# Patient Record
Sex: Male | Born: 1980 | Race: Black or African American | Hispanic: No | Marital: Married | Smoking: Never smoker
Health system: Southern US, Community
[De-identification: ages and names within clinical notes are randomized; demographics above are authoritative.]

---

## 2015-04-26 ENCOUNTER — Emergency Department (HOSPITAL_COMMUNITY): Payer: Self-pay

## 2015-04-26 ENCOUNTER — Encounter (HOSPITAL_COMMUNITY): Payer: Self-pay

## 2015-04-26 ENCOUNTER — Emergency Department (HOSPITAL_COMMUNITY)
Admission: EM | Admit: 2015-04-26 | Discharge: 2015-04-26 | Disposition: A | Discharge status: Home / Self Care | Payer: Self-pay | Attending: Emergency Medicine | Admitting: Emergency Medicine

## 2015-04-26 DIAGNOSIS — R079 Chest pain, unspecified: Secondary | ICD-10-CM | POA: Insufficient documentation

## 2015-04-26 LAB — COMPREHENSIVE METABOLIC PANEL
ALBUMIN: 4.2 g/dL (ref 3.5–5.0)
ALK PHOS: 102 U/L (ref 38–126)
ALT: 27 U/L (ref 17–63)
ANION GAP: 9 (ref 5–15)
AST: 24 U/L (ref 15–41)
BILIRUBIN TOTAL: 0.5 mg/dL (ref 0.3–1.2)
BUN: 9 mg/dL (ref 6–20)
CALCIUM: 9 mg/dL (ref 8.9–10.3)
CO2: 23 mmol/L (ref 22–32)
Chloride: 105 mmol/L (ref 101–111)
Creatinine, Ser: 0.76 mg/dL (ref 0.61–1.24)
GLUCOSE: 112 mg/dL — AB (ref 65–99)
POTASSIUM: 3.9 mmol/L (ref 3.5–5.1)
Sodium: 137 mmol/L (ref 135–145)
TOTAL PROTEIN: 8.4 g/dL — AB (ref 6.5–8.1)

## 2015-04-26 LAB — CBC WITH DIFFERENTIAL/PLATELET
BASOS PCT: 1 %
Basophils Absolute: 0 10*3/uL (ref 0.0–0.1)
Eosinophils Absolute: 0.2 10*3/uL (ref 0.0–0.7)
Eosinophils Relative: 3 %
HEMATOCRIT: 47.1 % (ref 39.0–52.0)
HEMOGLOBIN: 16.1 g/dL (ref 13.0–17.0)
LYMPHS ABS: 2.3 10*3/uL (ref 0.7–4.0)
LYMPHS PCT: 33 %
MCH: 30 pg (ref 26.0–34.0)
MCHC: 34.2 g/dL (ref 30.0–36.0)
MCV: 87.7 fL (ref 78.0–100.0)
MONO ABS: 0.5 10*3/uL (ref 0.1–1.0)
MONOS PCT: 7 %
NEUTROS ABS: 4 10*3/uL (ref 1.7–7.7)
NEUTROS PCT: 56 %
Platelets: 295 10*3/uL (ref 150–400)
RBC: 5.37 MIL/uL (ref 4.22–5.81)
RDW: 12.8 % (ref 11.5–15.5)
WBC: 6.9 10*3/uL (ref 4.0–10.5)

## 2015-04-26 LAB — TROPONIN I

## 2015-04-26 LAB — I-STAT TROPONIN, ED: Troponin i, poc: 0 ng/mL (ref 0.00–0.08)

## 2015-04-26 NOTE — ED Notes (Signed)
EKG given to EDP Oni for review 

## 2015-04-26 NOTE — ED Provider Notes (Signed)
CSN: [id number]     Arrival date & time 04/26/15  0359 History   First MD Initiated Contact with Patient 04/26/15 [phone]     Chief Complaint  Patient presents with  . Chest Pain     (Consider location/radiation/quality/duration/timing/severity/associated sxs/prior Treatment) Patient is a 34 y.o. male presenting with chest pain. The history is provided by the patient. No language interpreter was used.  Chest Pain Pain location:  L chest and epigastric Pain quality: sharp   Pain radiates to:  Does not radiate Pain radiates to the back: no   Onset quality:  Unable to specify Duration:  1 hour Timing:  Constant Progression:  Resolved Chronicity:  New Context: at rest   Context: not breathing and not eating   Relieved by:  Nothing Worsened by:  Nothing tried Associated symptoms: no heartburn and no shortness of breath   Risk factors: no hypertension   Pt awoke from sleep with chest pain.  Pt reports pain has resolved.    History reviewed. No pertinent past medical history. History reviewed. No pertinent past surgical history. History reviewed. No pertinent family history. Social History  Substance Use Topics  . Smoking status: Never Smoker   . Smokeless tobacco: None  . Alcohol Use: No    Review of Systems  Respiratory: Negative for shortness of breath.   Cardiovascular: Positive for chest pain.  Gastrointestinal: Negative for heartburn.  All other systems reviewed and are negative.     Allergies  Shellfish allergy  Home Medications   Prior to Admission medications   Not on File   BP 144/91 mmHg  Pulse 81  Temp(Src) 97.7 F (36.5 C) (Oral)  Resp 20  Ht 5\' 9"  (1.753 m)  Wt 225 lb (102.059 kg)  BMI 33.21 kg/m2  SpO2 95% Physical Exam  Constitutional: He appears well-developed and well-nourished.  HENT:  Head: Normocephalic.  Eyes: Pupils are equal, round, and reactive to light.  Neck: Normal range of motion.  Cardiovascular: Normal rate and regular  rhythm.   Pulmonary/Chest: Effort normal and breath sounds normal.  Abdominal: Soft.  Musculoskeletal: Normal range of motion.  Neurological: He is alert.  Skin: Skin is warm.  Psychiatric: He has a normal mood and affect.  Nursing note and vitals reviewed.   ED Course  Procedures (including critical care time) Labs Review Labs Reviewed - No data to display  Imaging Review Dg Chest 2 View  04/26/2015   CLINICAL DATA:  Awoke this morning with left mid chest pain.  EXAM: CHEST  2 VIEW  COMPARISON:  None.  FINDINGS: The cardiomediastinal contours are normal. The lungs are clear. Pulmonary vasculature is normal. No consolidation, pleural effusion, or pneumothorax. No acute osseous abnormalities are seen.  IMPRESSION: No acute pulmonary process.   Electronically Signed   By: Rubye Oaks M.D.   On: 04/26/2015 05:39   I have personally reviewed and evaluated these images and lab results as part of my medical decision-making.   EKG Interpretation   Date/Time:  Friday April 26 2015 04:10:26 EDT Ventricular Rate:  75 PR Interval:  139 QRS Duration: 83 QT Interval:  381 QTC Calculation: 425 R Axis:   57 Text Interpretation:  Sinus rhythm Confirmed by Erroll Luna  [phone]) on 04/26/2015 6:41:13 AM      MDM  Troponin negative.  Troponin is 3 hours post pain Cbc and cmet normal I suspect possible reflux.   Pt given primary care referrals.     Final diagnoses:  Nonspecific chest  pain    resouce guide Return if any problems.    Lonia Skinner [place], PA-C 04/26/15 4098  Tomasita Crumble, MD 04/26/15 1500

## 2015-04-26 NOTE — ED Notes (Signed)
Pt complains of chest pain when waking up this am, no other symptoms

## 2015-04-26 NOTE — Discharge Instructions (Signed)
Chest Pain (Nonspecific) °It is often hard to give a specific diagnosis for the cause of chest pain. There is always a chance that your pain could be related to something serious, such as a heart attack or a blood clot in the lungs. You need to follow up with your health care provider for further evaluation. °CAUSES  °· Heartburn. °· Pneumonia or bronchitis. °· Anxiety or stress. °· Inflammation around your heart (pericarditis) or lung (pleuritis or pleurisy). °· A blood clot in the lung. °· A collapsed lung (pneumothorax). It can develop suddenly on its own (spontaneous pneumothorax) or from trauma to the chest. °· Shingles infection (herpes zoster virus). °The chest wall is composed of bones, muscles, and cartilage. Any of these can be the source of the pain. °· The bones can be bruised by injury. °· The muscles or cartilage can be strained by coughing or overwork. °· The cartilage can be affected by inflammation and become sore (costochondritis). °DIAGNOSIS  °Lab tests or other studies may be needed to find the cause of your pain. Your health care provider may have you take a test called an ambulatory electrocardiogram (ECG). An ECG records your heartbeat patterns over a 24-hour period. You may also have other tests, such as: °· Transthoracic echocardiogram (TTE). During echocardiography, sound waves are used to evaluate how blood flows through your heart. °· Transesophageal echocardiogram (TEE). °· Cardiac monitoring. This allows your health care provider to monitor your heart rate and rhythm in real time. °· Holter monitor. This is a portable device that records your heartbeat and can help diagnose heart arrhythmias. It allows your health care provider to track your heart activity for several days, if needed. °· Stress tests by exercise or by giving medicine that makes the heart beat faster. °TREATMENT  °· Treatment depends on what may be causing your chest pain. Treatment may include: °¨ Acid blockers for  heartburn. °¨ Anti-inflammatory medicine. °¨ Pain medicine for inflammatory conditions. °¨ Antibiotics if an infection is present. °· You may be advised to change lifestyle habits. This includes stopping smoking and avoiding alcohol, caffeine, and chocolate. °· You may be advised to keep your head raised (elevated) when sleeping. This reduces the chance of acid going backward from your stomach into your esophagus. °Most of the time, nonspecific chest pain will improve within 2-3 days with rest and mild pain medicine.  °HOME CARE INSTRUCTIONS  °· If antibiotics were prescribed, take them as directed. Finish them even if you start to feel better. °· For the next few days, avoid physical activities that bring on chest pain. Continue physical activities as directed. °· Do not use any tobacco products, including cigarettes, chewing tobacco, or electronic cigarettes. °· Avoid drinking alcohol. °· Only take medicine as directed by your health care provider. °· Follow your health care provider's suggestions for further testing if your chest pain does not go away. °· Keep any follow-up appointments you made. If you do not go to an appointment, you could develop lasting (chronic) problems with pain. If there is any problem keeping an appointment, call to reschedule. °SEEK MEDICAL CARE IF:  °· Your chest pain does not go away, even after treatment. °· You have a rash with blisters on your chest. °· You have a fever. °SEEK IMMEDIATE MEDICAL CARE IF:  °· You have increased chest pain or pain that spreads to your arm, neck, jaw, back, or abdomen. °· You have shortness of breath. °· You have an increasing cough, or you cough   up blood. °· You have severe back or abdominal pain. °· You feel nauseous or vomit. °· You have severe weakness. °· You faint. °· You have chills. °This is an emergency. Do not wait to see if the pain will go away. Get medical help at once. Call your local emergency services (911 in [place] Do not drive  yourself to the hospital. °MAKE SURE YOU:  °· Understand these instructions. °· Will watch your condition. °· Will get help right away if you are not doing well or get worse. °Document Released: 05/06/2005 Document Revised: 08/01/2013 Document Reviewed: 03/01/2008 °ExitCare® Patient Information ©2015 [employer]. This information is not intended to replace advice given to you by your health care provider. Make sure you discuss any questions you have with your health care provider. ° ° °Emergency Department Resource Guide °1) Find a Doctor and Pay Out of Pocket °Although you won't have to find out who is covered by your insurance plan, it is a good idea to ask around and get recommendations. You will then need to call the office and see if the doctor you have chosen will accept you as a new patient and what types of options they offer for patients who are self-pay. Some doctors offer discounts or will set up payment plans for their patients who do not have insurance, but you will need to ask so you aren't surprised when you get to your appointment. ° °2) Contact Your Local Health Department °Not all health departments have doctors that can see patients for sick visits, but many do, so it is worth a call to see if yours does. If you don't know where your local health department is, you can check in your phone book. The CDC also has a tool to help you locate your state's health department, and many state websites also have listings of all of their local health departments. ° °3) Find a Walk-in Clinic °If your illness is not likely to be very severe or complicated, you may want to try a walk in clinic. These are popping up all over the country in pharmacies, drugstores, and shopping centers. They're usually staffed by nurse practitioners or physician assistants that have been trained to treat common illnesses and complaints. They're usually fairly quick and inexpensive. However, if you have serious medical issues or  chronic medical problems, these are probably not your best option. ° °No Primary Care Doctor: °- Call [facility] at  832-8000 - they can help you locate a primary care doctor that  accepts your insurance, provides certain services, etc. °- Physician Referral Service- 1-800-533-3463 ° °Chronic Pain Problems: °Organization         Address  Phone   Notes  °[facility]  (336) 297-2271 Patients need to be referred by their primary care doctor.  ° °Medication Assistance: °Organization         Address  Phone   Notes  °[place] [facility] [address] °[place], [place] [address] (336) 641-8030 --Must be a resident of [place] °-- Must have NO insurance coverage whatsoever (no Medicaid/ Medicare, etc.) °-- The pt. MUST have a primary care doctor that directs their care regularly and follows them in the community °  °[facility]  (866) 331-1348   °[employer]  (888) 892-1162   ° °Agencies that provide inexpensive medical care: °Organization         Address  Phone   Notes  °[facility]  (  336) 832-8035   °[facility]    (336) 832-7272   °[facility] [address] °[place], [place] [address] (336) 832-4777   °[facility] [address], °[place] (336) 271-4999   °[facility]    (336) 373-0678   °[facility]    (336) 272-1050   °[facility] ° [address], [place] Phone:  (336) 832-4444, Fax:  (336) 832-4440 Hours of Operation:  9 am - 6 pm, M-F.  Also accepts Medicaid/Medicare and self-pay.  °[facility] ° [address], [place] Phone: (336) 832-3150, Fax: (336) [phone]. Hours of Operation:  8:30 am - 5:30 pm, M-F.  Also accepts Medicaid and self-pay.  °[facility] [place] [address], [place] Phone: (336) 878-6027   °[facility] [address], [place], [place]  (336)723-1848, Ext. 123 Mondays & Thursdays: 7-9 AM.  First 15 patients are seen on a first come, first serve basis. °  ° °Medicaid-accepting [place] Providers: ° °Organization         Address  Phone   Notes  °[facility] [address], [place] (336) 641-2100 Also accepts self-pay patients.  °[facility] [address], [place] ° (336) 856-9996   °[facility] [address], [place] (336) 288-8857   °[facility] [address] [place] [address], [place] (336) 299-7000   °Veita Bland [address], [place]  ° (336) 373-1557 Only accepts [place] Access Medicaid patients after they have their name applied to their card.  ° °Self-Pay (no insurance) in [place]: ° °Organization         Address  Phone   Notes  °Sickle Cell Patients, [facility] [address], [place] (336) 832-1970   °[facility] [address], [place] (336) 832-4400   °[facility] [place] ° [address], [place] (336) 992-4800   °[facility]/Dr. Osei-Bonsu ° [address] [place] [address], [place] or [address], [place] (336) 841-8500 Phone number for both [place] and [place] locations is the same.  °[facility] [address], [place] (336) 299-0000   °[facility] [address] [place] [address], [place] or [address] (336) 852-7530 °(336) 878-2260   °[facility] [address], [place] (336) 350-1642, phone; (336) 294-5005, fax Sees patients 1st and 3rd Saturday of every month.  Must not qualify for public or private insurance (i.e. Medicaid, Medicare, [place] Health Choice, Veterans' Benefits) • Household income should be no more than 200% of the poverty level •The clinic cannot treat you if you are pregnant or think you are pregnant • Sexually transmitted  diseases are not treated at the clinic.  ° ° °Dental Care: °Organization         Address  Phone  Notes  °[facility] [address],  (336) 641-6152 Accepts children up to age 21 who are enrolled in Medicaid or [place] Health Choice; pregnant women with a Medicaid card; and children who have applied for Medicaid or [place] Health Choice, but were declined, whose parents can pay a reduced fee at time of service.  °[facility] [place]    [address], [place] (336) 641-7733 Accepts children up to age 21 who are enrolled in Medicaid or [place] Health Choice; pregnant women with a Medicaid card; and children who have applied for Medicaid or [place] Health Choice, but were declined, whose parents can pay a reduced fee at time of service.  °[facility] ° [address], [place] (336) 641-4533 Patients are seen by appointment only. Walk-ins are not accepted. [facility] will see patients 18 years of age and older. °Monday - Tuesday (8am-5pm) °Most Wednesdays (8:30-5pm) °$30 per visit, cash only  °[facility] ° [address], [place] (336) 641-4533 Patients are seen by appointment only. Walk-ins are not accepted. [facility] will see patients 18 years of age and older. °One Wednesday Evening (Monthly: Volunteer Based).  $30 per visit, cash only  °[facility]  (919) 537-3737 for adults; Children under age 4, call Graduate Pediatric Dentistry at ([phone]) [phone]. Children aged 4-14, please call (919) [phone] to request a pediatric application. ° Dental services are provided in all areas of dental care including fillings, crowns and bridges, complete and partial dentures, implants, gum treatment, root canals, and extractions. Preventive care is also provided. Treatment is provided to both adults and children. °Patients are selected via a  lottery and there is often a waiting list. °  °[facility] [address], °[place] ° (336) 763-8833 [email or web address] °  °[facility] [address], [place], [place] (336)723-1848, Ext. 123 Second and Fourth Thursday of each month, opens at 6:30 AM; Clinic ends at 9 AM.  Patients are seen on a first-come first-served basis, and a limited number are seen during each clinic.  ° °[facility] ° [address], [place], [place] (336) 723-7904   Eligibility Requirements °You must have lived in [place], [place], or [place] counties for at least the last three months. °  You cannot be eligible for state or federal sponsored healthcare insurance, including Veterans Administration, Medicaid, or Medicare. °  You generally cannot be eligible for healthcare insurance through your employer.  °  How to apply: °Eligibility screenings are held every Tuesday and Wednesday afternoon from 1:00 pm until 4:00 pm. You do not need an appointment for the interview!  °[facility] [address], [place], [place] 336-631-2330   °[facility]  336-342-8273   °[facility]  336-703-3100   °[facility]  336-570-6415   ° °Behavioral Health Resources in the Community: °Intensive Outpatient Programs °Organization         Address  Phone  Notes  °[facility] [address], [place], [place] 336-878-6098   °[facility] [address], [place], [place] 336-832-9800   °[facility] [address], [place], [place] ° 336-882-2125   °[facility] [address],  °[place], [place] 1-800-853-5163 or 336-641-4981   °Substance Abuse Resources °Organization         Address  Phone  Notes  °[facility]  336-882-2125   °[facility]  336-784-9470   °[facility]  336-285-9073   °[facility]  336-845-3988   °Residential &  Outpatient Substance Abuse Program  1-800-659-3381   °Psychological Services °Organization         Address  Phone  Notes  °[facility]  336- 832-9600   °  [facility]  336- 378-7881   °[facility] [address], [place] 1-800-853-5163 or 336-641-4981   ° °Mobile Crisis Teams °Organization         Address  Phone  Notes  °[facility], Mobile Crisis Care Unit  1-877-626-1772   °Assertive °[facility] ° [address] [place], [place] 336-834-9664   °Sharon DeEsch [address] °[place] 336-554-5454   ° °Self-Help/Support Groups °Organization         Address  Phone             Notes  °[facility] - variety of support groups  336- 373-1402 Call for more information  °Narcotics Anonymous (NA), [facility] [address], °[place]  2 meetings at this location  ° °Residential Treatment Programs °Organization         Address  Phone  Notes  °[facility] [address],    °[place]  1-866-801-8205   °[facility] ° [address], [place], [place] 704-293-8524   °[facility] [address], [place] 336-845-3988 Admissions: 8am-3pm M-F  °[facility] [address]    °[place], [place] 336-841-1104   °[facility] [address], [place], [place] 336-379-7146   °[facility] [address]  °[place], [place] 336-285-9073   °[facility] - Intensive Outpatient [address], [place], [place] 336-852-3033   °[facility] [address]  °[place], [place] 1-877-615-2722 or 336-784-9470   °[facility] ([facility] [address] [place], [place] 336-227-7417 Accepts Medicaid  °[facility] [address]  ° [place] 1-800-659-3381 Substance Abuse/Addiction Treatment  ° °[place] Behavioral Health Resources °Organization          Address  Phone  Notes  °[facility]  (888) 581-9988   °Julie Brannon, PhD [address] [place], [place]   (336) 349-5553 or (336) 951-0000   °[facility]   [address] °[place], [place] (336) 349-4454   °[facility] [address], [place], [place] (336) 342-8316 Insurance/Medicaid/sponsorship through [facility]  °[facility] [address]                                    [place], [place] (336) 342-8316 Therapy/tele-psych/case  °[facility] [address]  ° [place], [place] (336) 349-2233    °Dr. Arfeen  (336) 349-4544   °[facility] [place]  [employer] [place] [facility] 1) [address], [place] °2) [address], [place] °3)  [address], [place] (336) 349-3220 °(336) 342-7768 ° °(336) 342-8140   °[place] [facility] (336) 342-1394 or (336) 342-3537 (After Hours)    ° ° ° °

## 2015-04-26 NOTE — ED Notes (Signed)
Pt from home states his child woke him up. Once he was awake, he noticed he had a sharp pain in the left side of his chest that does not radiate anywhere. The pain began around 0300. Pt states the pain has mostly dissipated.  Pt states he had no other symptoms. He does not smoke, but he can not verify much of the rest of his medical history because he "hasnt seen a doctor in a long time."

## 2017-01-01 IMAGING — CR DG CHEST 2V
2 series · 2 of 2 positions shown · non-contrast
Comparison: None.

CLINICAL DATA: Awoke this morning with left mid chest pain.

EXAM:
CHEST  2 VIEW

[w chest pa]
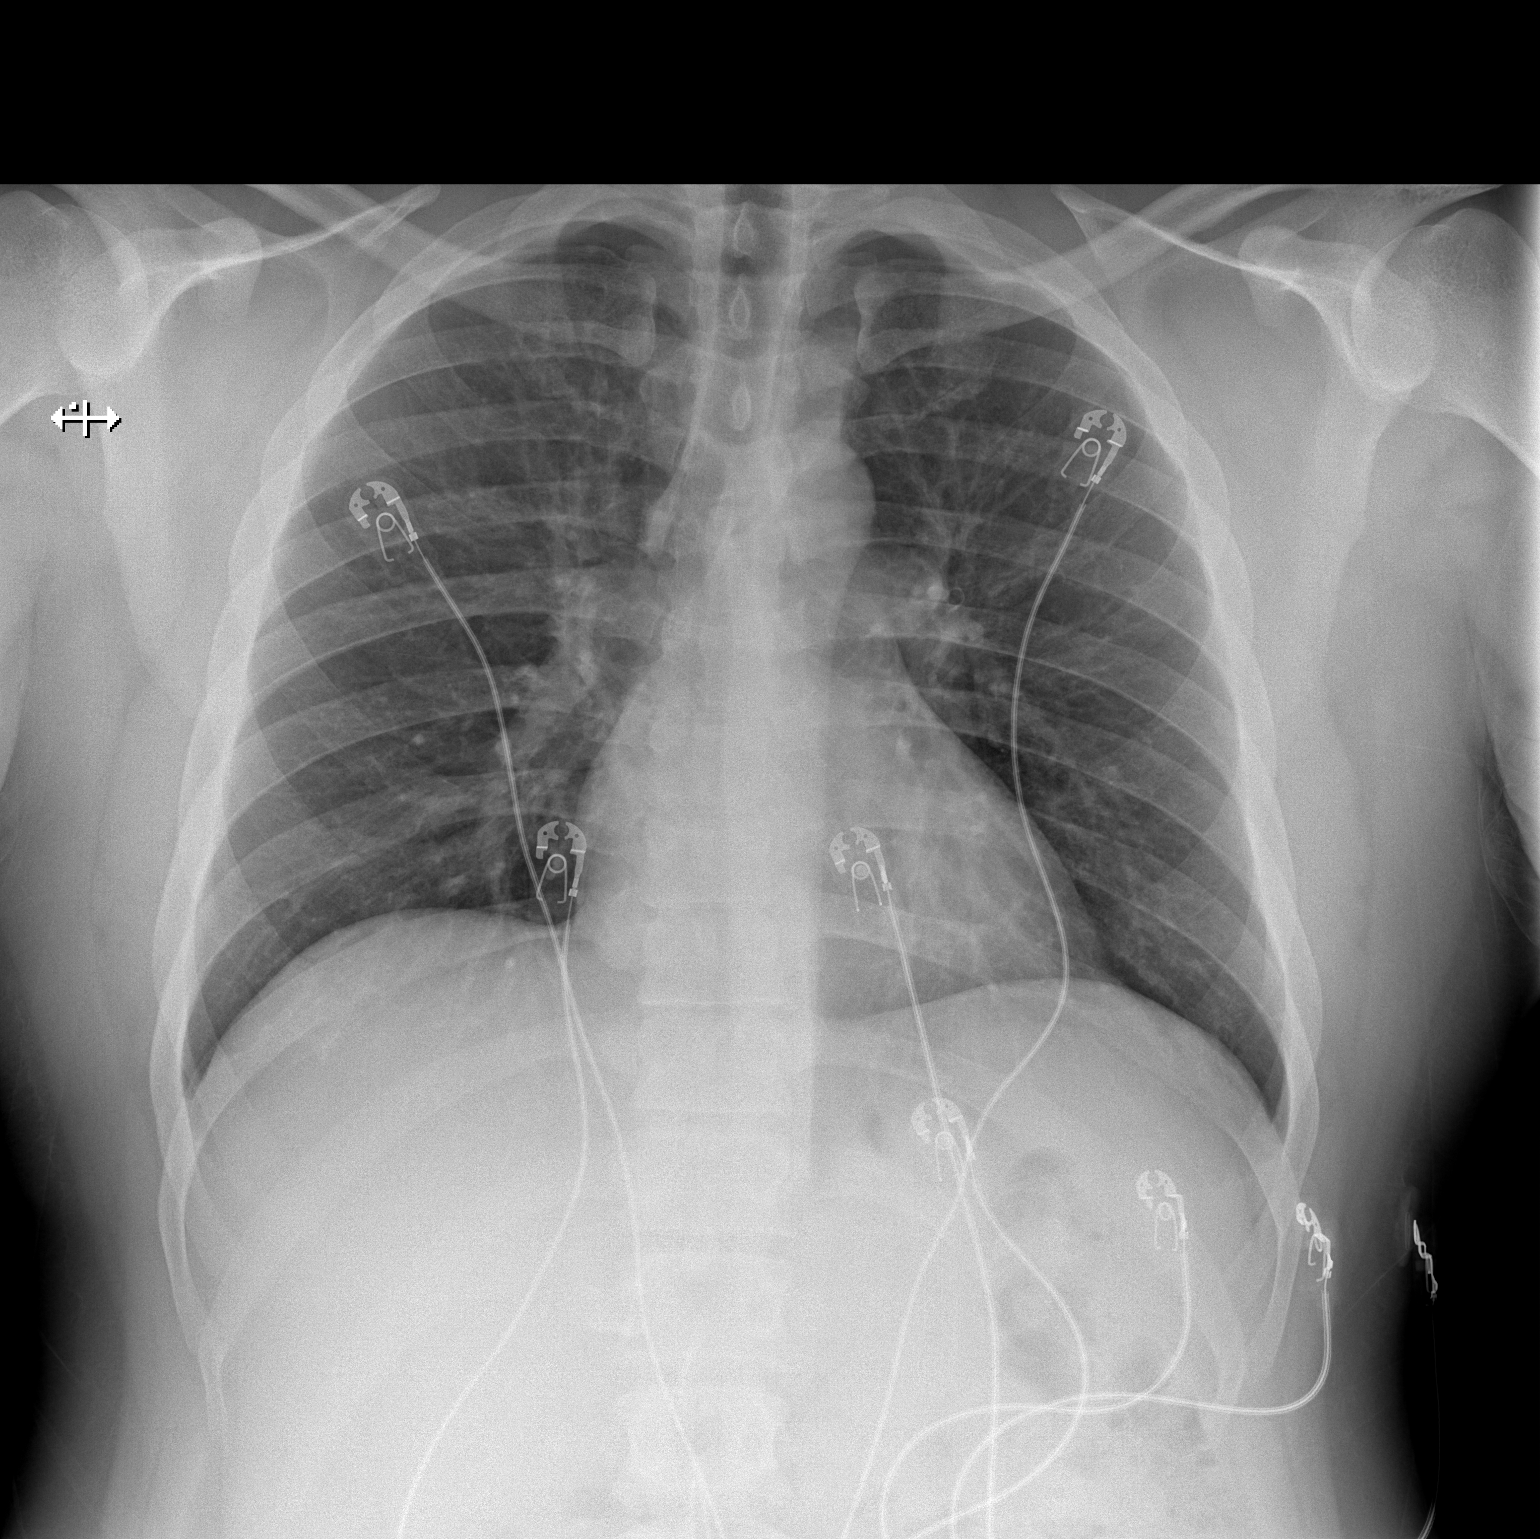

[w chest lat]
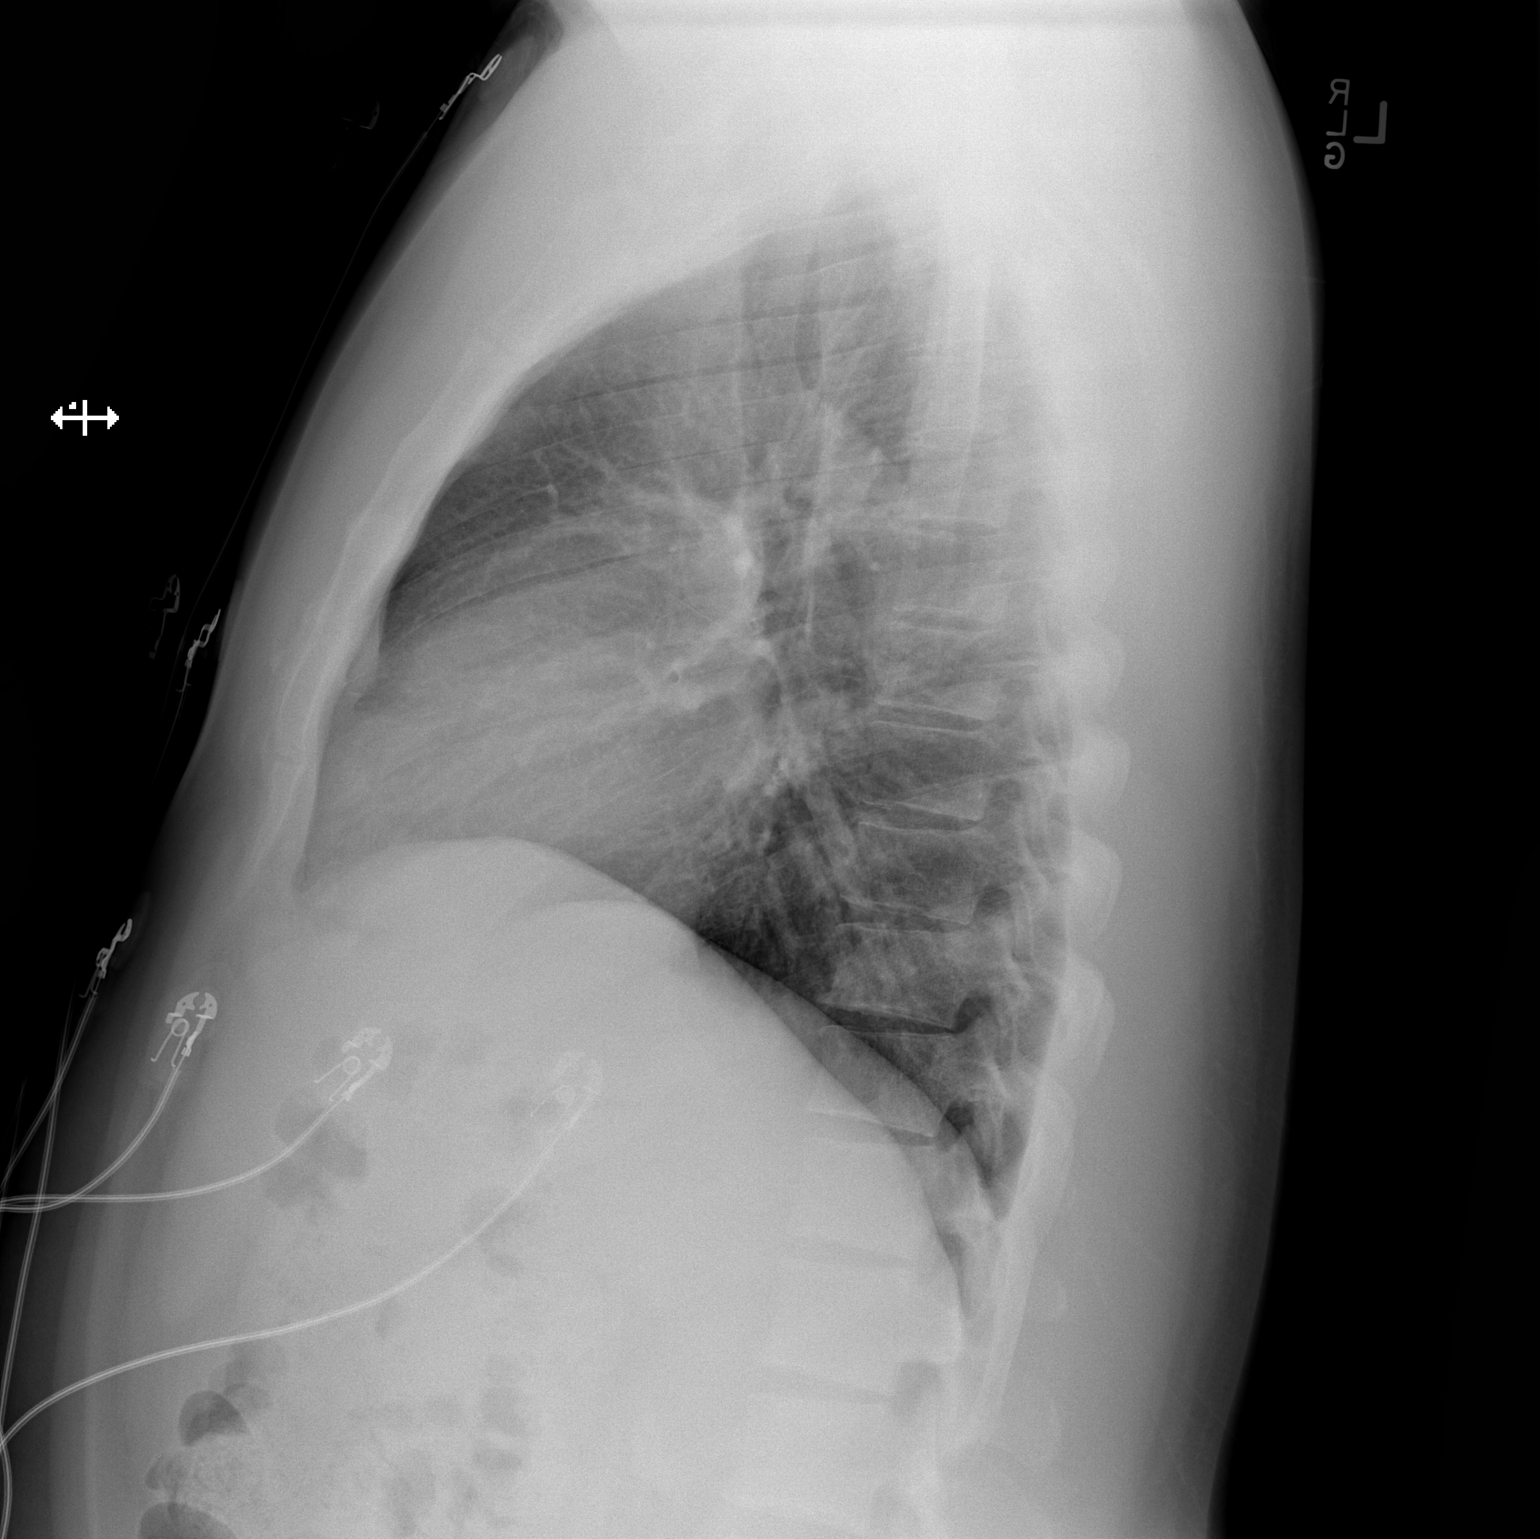

[2 of 2 positions shown; findings below may reference images not displayed]

FINDINGS: The cardiomediastinal contours are normal. The lungs are clear.
Pulmonary vasculature is normal. No consolidation, pleural effusion,
or pneumothorax. No acute osseous abnormalities are seen.
IMPRESSION: No acute pulmonary process.
